# Patient Record
Sex: Male | Born: 2000 | Hispanic: No | Marital: Single
Health system: Southern US, Community
[De-identification: ages and names within clinical notes are randomized; demographics above are authoritative.]

## PROBLEM LIST (undated history)

## (undated) DIAGNOSIS — Z789 Other specified health status: Secondary | ICD-10-CM

## (undated) HISTORY — PX: APPENDECTOMY: SHX54

---
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
## 2022-05-07 DIAGNOSIS — J029 Acute pharyngitis, unspecified: Secondary | ICD-10-CM | POA: Diagnosis not present

## 2022-05-07 DIAGNOSIS — L299 Pruritus, unspecified: Secondary | ICD-10-CM | POA: Diagnosis not present

## 2022-05-07 DIAGNOSIS — L42 Pityriasis rosea: Secondary | ICD-10-CM | POA: Diagnosis not present

## 2022-05-07 DIAGNOSIS — L709 Acne, unspecified: Secondary | ICD-10-CM | POA: Diagnosis not present
